# Patient Record
Sex: Male | Born: 2000 | Race: White | Hispanic: No | Marital: Single | Smoking: Never smoker
Health system: Southern US, Community
[De-identification: ages and names within clinical notes are randomized; demographics above are authoritative.]

## PROBLEM LIST (undated history)

## (undated) DIAGNOSIS — F988 Other specified behavioral and emotional disorders with onset usually occurring in childhood and adolescence: Secondary | ICD-10-CM

## (undated) HISTORY — DX: Other specified behavioral and emotional disorders with onset usually occurring in childhood and adolescence: F98.8

---
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
## 2020-03-14 IMAGING — MR MRI HEAD WITHOUT CONTRAST
10 series · 48 of 48 positions shown · non-contrast
Comparison: None.

CLINICAL DATA: 17-year-old male with chronic nausea and headaches.
History of head injury at age 6. Concentration deficit.

EXAM:
MRI HEAD WITHOUT CONTRAST
TECHNIQUE: Multiplanar, multiecho pulse sequences of the brain and surrounding
structures were obtained without intravenous contrast.

[Series 5: T1 · sagittal · 4.0mm · 0.75mm/px · 3 of 33 slices shown (1 of 2)]
[im 1/33]
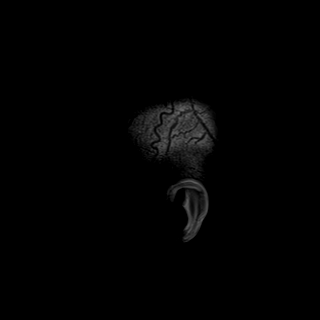
[im 17/33]
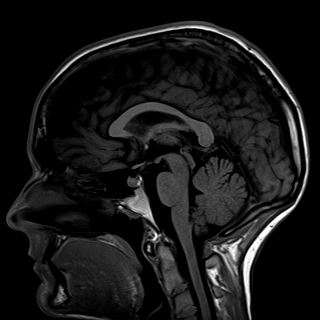
[im 33/33]
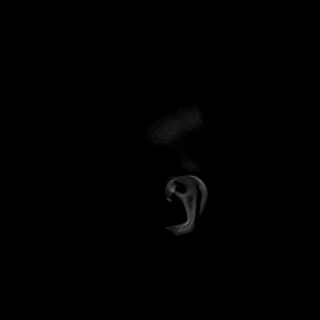

[Series 6: DWI · axial · 3.0mm · 1.50mm/px · z∈[-71,+93]mm · 7 of 102 slices shown (1 of 4)]
[im 1/102]
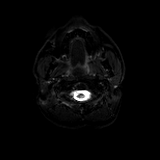
[im 17/102]
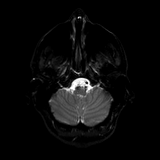
[im 34/102]
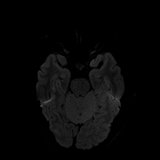
[im 51/102]
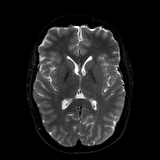
[im 68/102]
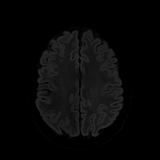
[im 85/102]
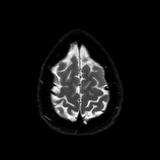
[im 102/102]
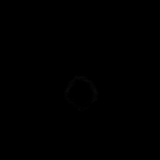

[Series 7: DWI · axial · 3.0mm · 1.50mm/px · z∈[-71,+93]mm · 4 of 50 slices shown (2 of 4)]
[im 1/50]
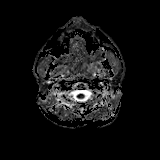
[im 17/50]
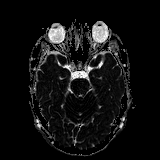
[im 33/50]
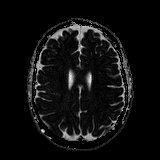
[im 50/50]
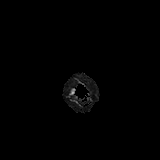

[Series 8: DWI · coronal · 5.0mm · 1.44mm/px · 5 of 73 slices shown (3 of 4)]
[im 1/73]
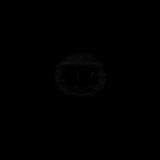
[im 19/73]
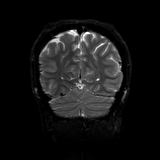
[im 37/73]
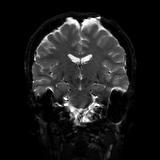
[im 55/73]
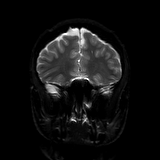
[im 73/73]
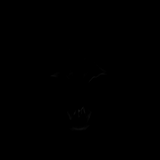

[Series 9: DWI · coronal · 5.0mm · 1.44mm/px · 3 of 37 slices shown (4 of 4)]
[im 1/37]
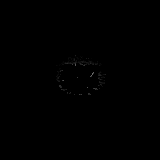
[im 19/37]
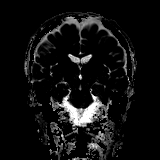
[im 37/37]
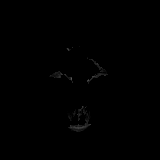

[Series 10: T2 · axial · 4.0mm · 0.38mm/px · z∈[-68,+97]mm · 2 of 33 slices shown (1 of 2)]
[im 1/33]
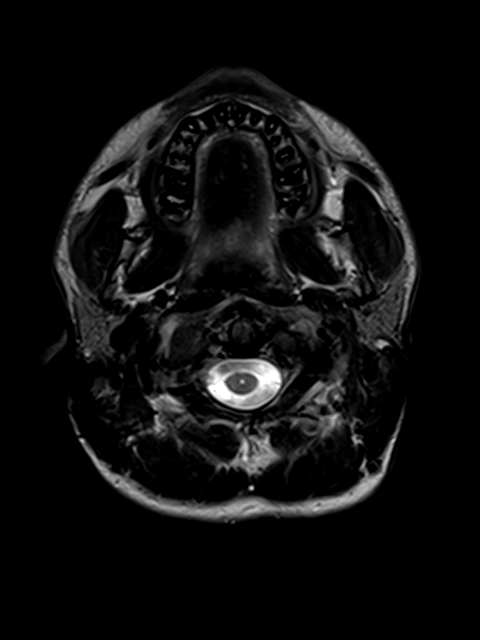
[im 33/33]
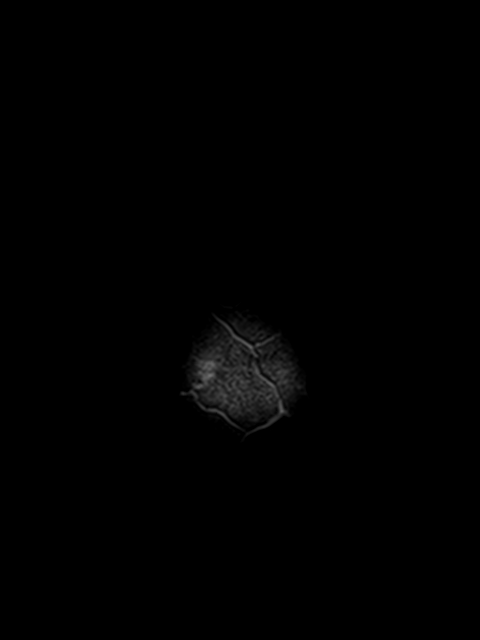

[Series 11: FLAIR · axial · 3.0mm · 0.75mm/px · z∈[-75,+97]mm · 2 of 30 slices shown]
[im 1/30]
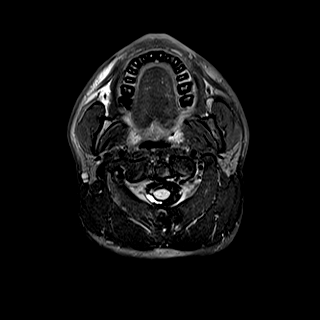
[im 30/30]
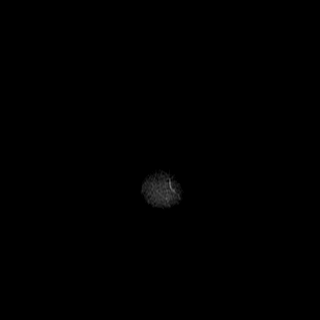

[Series 13: swi_images · axial · 1.5mm · 0.94mm/px · z∈[-68,+97]mm · 8 of 112 slices shown]
[im 1/112]
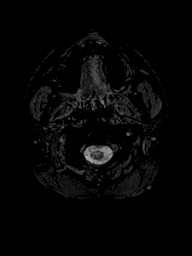
[im 16/112]
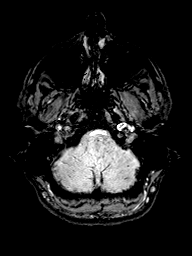
[im 32/112]
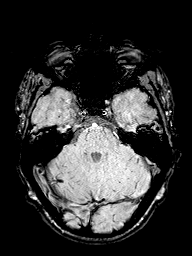
[im 48/112]
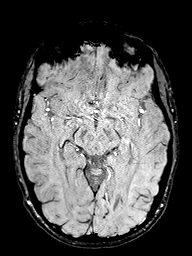
[im 64/112]
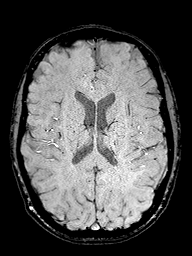
[im 80/112]
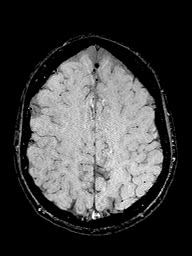
[im 96/112]
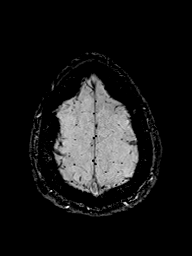
[im 112/112]
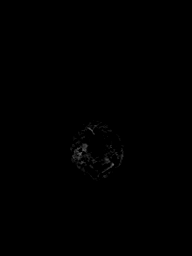

[Series 14: T1 · axial · 1.0mm · 0.94mm/px · z∈[-73,+101]mm · 12 of 176 slices shown (2 of 2)]
[im 1/176]
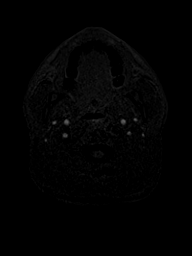
[im 16/176]
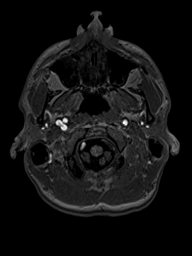
[im 32/176]
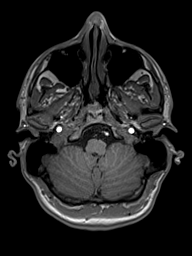
[im 48/176]
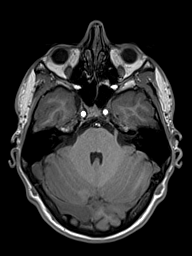
[im 64/176]
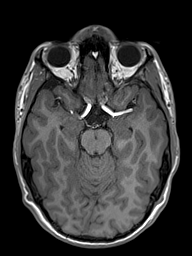
[im 80/176]
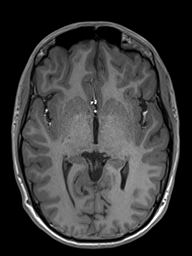
[im 96/176]
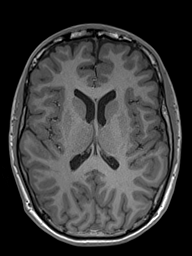
[im 112/176]
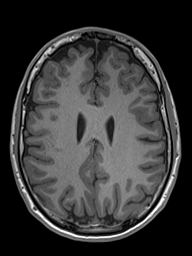
[im 128/176]
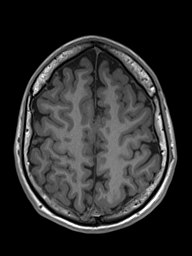
[im 144/176]
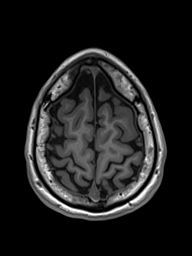
[im 160/176]
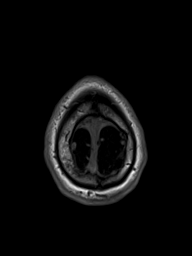
[im 176/176]
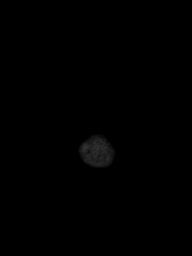

[Series 15: T2 · coronal · 4.5mm · 0.36mm/px · 2 of 35 slices shown (2 of 2)]
[im 1/35]
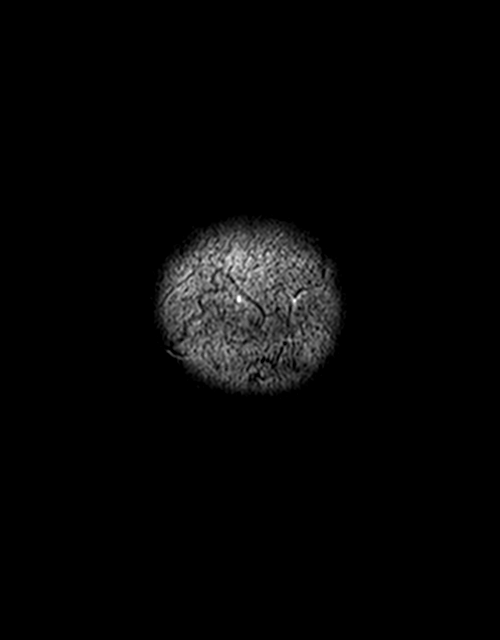
[im 35/35]
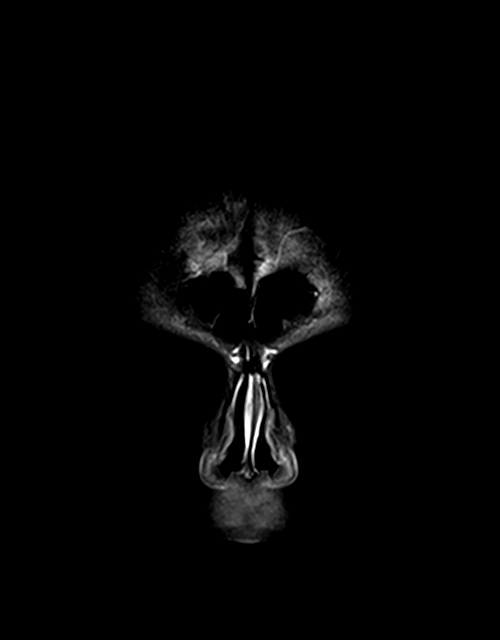

[48 of 48 positions shown; findings below may reference images not displayed]

FINDINGS: Brain: No restricted diffusion to suggest acute infarction. No
midline shift, mass effect, evidence of mass lesion,
ventriculomegaly, extra-axial collection or acute intracranial
hemorrhage. Cervicomedullary junction and pituitary are within
normal limits.

Gray and white matter signal is within normal limits throughout the
brain. No encephalomalacia or chronic cerebral blood products
identified.

Vascular: Major intracranial vascular flow voids are preserved.
Mildly tortuous distal vertebral arteries.

Skull and upper cervical spine: Negative visible cervical spine.
Visualized bone marrow signal is within normal limits.

Sinuses/Orbits: Negative orbits. Mild posterior ethmoid sinus
mucosal thickening. Small right maxillary sinus alveolar recess
mucous retention cyst (series 15, image 30).

Other: Mastoids are clear. Visible internal auditory structures
appear normal. Small nasopharyngeal midline retention cyst (series
10 image 6, normal variant). Scalp and face soft tissues appear
negative.
IMPRESSION: 1.  Normal MRI appearance of the brain.
2. Mild paranasal sinus mucosal disease, significance doubtful.

## 2021-05-21 ENCOUNTER — Encounter: Payer: Self-pay | Admitting: Family

## 2022-11-10 DIAGNOSIS — R000 Tachycardia, unspecified: Secondary | ICD-10-CM | POA: Diagnosis not present

## 2022-11-10 DIAGNOSIS — F481 Depersonalization-derealization syndrome: Secondary | ICD-10-CM | POA: Diagnosis not present

## 2022-11-10 DIAGNOSIS — F39 Unspecified mood [affective] disorder: Secondary | ICD-10-CM | POA: Diagnosis not present

## 2022-11-10 DIAGNOSIS — R440 Auditory hallucinations: Secondary | ICD-10-CM | POA: Diagnosis not present

## 2022-11-10 DIAGNOSIS — F449 Dissociative and conversion disorder, unspecified: Secondary | ICD-10-CM | POA: Diagnosis not present

## 2022-11-10 DIAGNOSIS — R45851 Suicidal ideations: Secondary | ICD-10-CM | POA: Diagnosis not present

## 2022-11-11 DIAGNOSIS — F481 Depersonalization-derealization syndrome: Secondary | ICD-10-CM | POA: Diagnosis not present

## 2022-11-11 DIAGNOSIS — R440 Auditory hallucinations: Secondary | ICD-10-CM | POA: Diagnosis not present

## 2022-11-11 DIAGNOSIS — F449 Dissociative and conversion disorder, unspecified: Secondary | ICD-10-CM | POA: Diagnosis not present

## 2022-11-11 DIAGNOSIS — R45851 Suicidal ideations: Secondary | ICD-10-CM | POA: Diagnosis not present

## 2022-11-12 DIAGNOSIS — F39 Unspecified mood [affective] disorder: Secondary | ICD-10-CM | POA: Diagnosis not present

## 2022-11-12 DIAGNOSIS — R440 Auditory hallucinations: Secondary | ICD-10-CM | POA: Diagnosis not present

## 2022-11-12 DIAGNOSIS — Z9152 Personal history of nonsuicidal self-harm: Secondary | ICD-10-CM | POA: Diagnosis not present

## 2022-11-12 DIAGNOSIS — R45851 Suicidal ideations: Secondary | ICD-10-CM | POA: Diagnosis not present

## 2022-11-12 DIAGNOSIS — F4310 Post-traumatic stress disorder, unspecified: Secondary | ICD-10-CM | POA: Diagnosis not present

## 2022-11-12 DIAGNOSIS — F449 Dissociative and conversion disorder, unspecified: Secondary | ICD-10-CM | POA: Diagnosis not present

## 2022-11-12 DIAGNOSIS — F411 Generalized anxiety disorder: Secondary | ICD-10-CM | POA: Diagnosis not present

## 2022-11-12 DIAGNOSIS — F23 Brief psychotic disorder: Secondary | ICD-10-CM | POA: Diagnosis not present

## 2022-11-12 DIAGNOSIS — Z62810 Personal history of physical and sexual abuse in childhood: Secondary | ICD-10-CM | POA: Diagnosis not present

## 2022-11-12 DIAGNOSIS — F251 Schizoaffective disorder, depressive type: Secondary | ICD-10-CM | POA: Diagnosis not present

## 2022-11-12 DIAGNOSIS — F1220 Cannabis dependence, uncomplicated: Secondary | ICD-10-CM | POA: Diagnosis not present

## 2022-11-12 DIAGNOSIS — Z743 Need for continuous supervision: Secondary | ICD-10-CM | POA: Diagnosis not present

## 2022-11-12 DIAGNOSIS — Z681 Body mass index (BMI) 19 or less, adult: Secondary | ICD-10-CM | POA: Diagnosis not present

## 2022-11-12 DIAGNOSIS — Z62811 Personal history of psychological abuse in childhood: Secondary | ICD-10-CM | POA: Diagnosis not present

## 2022-11-12 DIAGNOSIS — N390 Urinary tract infection, site not specified: Secondary | ICD-10-CM | POA: Diagnosis not present

## 2022-11-12 DIAGNOSIS — F481 Depersonalization-derealization syndrome: Secondary | ICD-10-CM | POA: Diagnosis not present

## 2022-11-12 DIAGNOSIS — R000 Tachycardia, unspecified: Secondary | ICD-10-CM | POA: Diagnosis not present

## 2022-11-16 DIAGNOSIS — Z681 Body mass index (BMI) 19 or less, adult: Secondary | ICD-10-CM | POA: Diagnosis not present

## 2022-11-16 DIAGNOSIS — F251 Schizoaffective disorder, depressive type: Secondary | ICD-10-CM | POA: Diagnosis not present

## 2022-11-16 DIAGNOSIS — R45851 Suicidal ideations: Secondary | ICD-10-CM | POA: Diagnosis not present

## 2022-11-16 DIAGNOSIS — N390 Urinary tract infection, site not specified: Secondary | ICD-10-CM | POA: Diagnosis not present

## 2023-01-14 DIAGNOSIS — R5382 Chronic fatigue, unspecified: Secondary | ICD-10-CM | POA: Diagnosis not present

## 2023-01-14 DIAGNOSIS — Z79899 Other long term (current) drug therapy: Secondary | ICD-10-CM | POA: Diagnosis not present

## 2023-01-29 DIAGNOSIS — Z79899 Other long term (current) drug therapy: Secondary | ICD-10-CM | POA: Diagnosis not present

## 2023-01-29 DIAGNOSIS — R5382 Chronic fatigue, unspecified: Secondary | ICD-10-CM | POA: Diagnosis not present

## 2023-04-01 ENCOUNTER — Other Ambulatory Visit (HOSPITAL_BASED_OUTPATIENT_CLINIC_OR_DEPARTMENT_OTHER): Payer: Self-pay

## 2023-04-01 ENCOUNTER — Other Ambulatory Visit (HOSPITAL_COMMUNITY): Payer: Self-pay

## 2023-04-01 DIAGNOSIS — F909 Attention-deficit hyperactivity disorder, unspecified type: Secondary | ICD-10-CM | POA: Diagnosis not present

## 2023-04-01 DIAGNOSIS — F1290 Cannabis use, unspecified, uncomplicated: Secondary | ICD-10-CM | POA: Diagnosis not present

## 2023-04-01 DIAGNOSIS — F3189 Other bipolar disorder: Secondary | ICD-10-CM | POA: Diagnosis not present

## 2023-04-01 MED ORDER — RISPERIDONE 2 MG PO TABS
2.0000 mg | ORAL_TABLET | Freq: Every day | ORAL | 0 refills | Status: DC
  Filled 2023-04-01: qty 30, 30d supply, fill #0

## 2023-04-01 MED ORDER — LITHIUM CARBONATE 600 MG PO CAPS
600.0000 mg | ORAL_CAPSULE | Freq: Every day | ORAL | 0 refills | Status: DC
  Filled 2023-04-01: qty 30, 30d supply, fill #0

## 2023-04-03 ENCOUNTER — Other Ambulatory Visit (HOSPITAL_COMMUNITY): Payer: Self-pay

## 2023-05-02 DIAGNOSIS — F909 Attention-deficit hyperactivity disorder, unspecified type: Secondary | ICD-10-CM | POA: Diagnosis not present

## 2023-05-02 DIAGNOSIS — F3189 Other bipolar disorder: Secondary | ICD-10-CM | POA: Diagnosis not present

## 2023-05-02 DIAGNOSIS — F1290 Cannabis use, unspecified, uncomplicated: Secondary | ICD-10-CM | POA: Diagnosis not present

## 2023-05-08 ENCOUNTER — Other Ambulatory Visit (HOSPITAL_COMMUNITY): Payer: Self-pay

## 2023-05-08 MED ORDER — RISPERIDONE 2 MG PO TABS
2.0000 mg | ORAL_TABLET | Freq: Every day | ORAL | 0 refills | Status: AC
  Filled 2023-07-28: qty 30, 30d supply, fill #0

## 2023-05-08 MED ORDER — LITHIUM CARBONATE 600 MG PO CAPS
600.0000 mg | ORAL_CAPSULE | Freq: Every day | ORAL | 1 refills | Status: AC
  Filled 2023-05-08: qty 30, 30d supply, fill #0
  Filled 2023-07-28: qty 30, 30d supply, fill #1

## 2023-05-08 MED ORDER — RISPERIDONE 2 MG PO TABS
2.0000 mg | ORAL_TABLET | Freq: Every day | ORAL | 0 refills | Status: AC
  Filled 2023-05-08: qty 30, 30d supply, fill #0

## 2023-06-30 ENCOUNTER — Other Ambulatory Visit (HOSPITAL_COMMUNITY): Payer: Self-pay

## 2023-06-30 DIAGNOSIS — F909 Attention-deficit hyperactivity disorder, unspecified type: Secondary | ICD-10-CM | POA: Diagnosis not present

## 2023-06-30 DIAGNOSIS — F3189 Other bipolar disorder: Secondary | ICD-10-CM | POA: Diagnosis not present

## 2023-06-30 DIAGNOSIS — F1290 Cannabis use, unspecified, uncomplicated: Secondary | ICD-10-CM | POA: Diagnosis not present

## 2023-06-30 MED ORDER — RISPERIDONE 2 MG PO TABS
ORAL_TABLET | ORAL | 1 refills | Status: DC
  Filled 2023-06-30: qty 30, 30d supply, fill #0

## 2023-06-30 MED ORDER — LITHIUM CARBONATE 600 MG PO CAPS
ORAL_CAPSULE | ORAL | 1 refills | Status: AC
  Filled 2023-06-30: qty 30, 30d supply, fill #0

## 2023-07-01 ENCOUNTER — Other Ambulatory Visit (HOSPITAL_COMMUNITY): Payer: Self-pay

## 2023-07-02 ENCOUNTER — Other Ambulatory Visit (HOSPITAL_COMMUNITY): Payer: Self-pay

## 2023-07-12 ENCOUNTER — Other Ambulatory Visit (HOSPITAL_COMMUNITY): Payer: Self-pay

## 2023-07-28 ENCOUNTER — Other Ambulatory Visit (HOSPITAL_COMMUNITY): Payer: Self-pay

## 2023-08-25 ENCOUNTER — Other Ambulatory Visit (HOSPITAL_BASED_OUTPATIENT_CLINIC_OR_DEPARTMENT_OTHER): Payer: Self-pay

## 2023-08-25 ENCOUNTER — Other Ambulatory Visit (HOSPITAL_COMMUNITY): Payer: Self-pay

## 2023-08-25 DIAGNOSIS — F1290 Cannabis use, unspecified, uncomplicated: Secondary | ICD-10-CM | POA: Diagnosis not present

## 2023-08-25 DIAGNOSIS — F3189 Other bipolar disorder: Secondary | ICD-10-CM | POA: Diagnosis not present

## 2023-08-25 DIAGNOSIS — F909 Attention-deficit hyperactivity disorder, unspecified type: Secondary | ICD-10-CM | POA: Diagnosis not present

## 2023-08-25 MED ORDER — LITHIUM CARBONATE 600 MG PO CAPS
600.0000 mg | ORAL_CAPSULE | Freq: Every day | ORAL | 1 refills | Status: AC
  Filled 2023-08-25: qty 30, 30d supply, fill #0

## 2023-08-28 ENCOUNTER — Other Ambulatory Visit (HOSPITAL_COMMUNITY): Payer: Self-pay

## 2023-08-30 ENCOUNTER — Other Ambulatory Visit (HOSPITAL_COMMUNITY): Payer: Self-pay

## 2023-09-18 ENCOUNTER — Other Ambulatory Visit (HOSPITAL_COMMUNITY): Payer: Self-pay

## 2023-10-20 ENCOUNTER — Other Ambulatory Visit (HOSPITAL_COMMUNITY): Payer: Self-pay

## 2023-10-20 DIAGNOSIS — F1290 Cannabis use, unspecified, uncomplicated: Secondary | ICD-10-CM | POA: Diagnosis not present

## 2023-10-20 DIAGNOSIS — F3189 Other bipolar disorder: Secondary | ICD-10-CM | POA: Diagnosis not present

## 2023-10-20 DIAGNOSIS — F909 Attention-deficit hyperactivity disorder, unspecified type: Secondary | ICD-10-CM | POA: Diagnosis not present

## 2023-10-20 MED ORDER — LITHIUM CARBONATE 600 MG PO CAPS
600.0000 mg | ORAL_CAPSULE | Freq: Every day | ORAL | 1 refills | Status: AC
  Filled 2023-10-20 – 2023-11-08 (×2): qty 30, 30d supply, fill #0

## 2023-10-30 ENCOUNTER — Other Ambulatory Visit (HOSPITAL_COMMUNITY): Payer: Self-pay

## 2023-11-06 ENCOUNTER — Other Ambulatory Visit (HOSPITAL_COMMUNITY): Payer: Self-pay

## 2023-11-07 ENCOUNTER — Other Ambulatory Visit (HOSPITAL_COMMUNITY): Payer: Self-pay

## 2023-11-08 ENCOUNTER — Other Ambulatory Visit (HOSPITAL_COMMUNITY): Payer: Self-pay

## 2023-12-12 ENCOUNTER — Other Ambulatory Visit (HOSPITAL_COMMUNITY): Payer: Self-pay

## 2023-12-12 DIAGNOSIS — F909 Attention-deficit hyperactivity disorder, unspecified type: Secondary | ICD-10-CM | POA: Diagnosis not present

## 2023-12-12 DIAGNOSIS — F3189 Other bipolar disorder: Secondary | ICD-10-CM | POA: Diagnosis not present

## 2023-12-12 DIAGNOSIS — F1290 Cannabis use, unspecified, uncomplicated: Secondary | ICD-10-CM | POA: Diagnosis not present

## 2023-12-12 MED ORDER — LITHIUM CARBONATE 600 MG PO CAPS
600.0000 mg | ORAL_CAPSULE | Freq: Every day | ORAL | 1 refills | Status: AC
  Filled 2023-12-12: qty 30, 30d supply, fill #0

## 2023-12-12 MED ORDER — RISPERIDONE 2 MG PO TABS
2.0000 mg | ORAL_TABLET | Freq: Every day | ORAL | 0 refills | Status: AC
  Filled 2023-12-12: qty 30, 30d supply, fill #0

## 2023-12-15 ENCOUNTER — Other Ambulatory Visit (HOSPITAL_COMMUNITY): Payer: Self-pay

## 2023-12-19 DIAGNOSIS — Z113 Encounter for screening for infections with a predominantly sexual mode of transmission: Secondary | ICD-10-CM | POA: Diagnosis not present

## 2023-12-19 DIAGNOSIS — Z23 Encounter for immunization: Secondary | ICD-10-CM | POA: Diagnosis not present

## 2023-12-19 DIAGNOSIS — N3000 Acute cystitis without hematuria: Secondary | ICD-10-CM | POA: Diagnosis not present

## 2023-12-22 ENCOUNTER — Other Ambulatory Visit (HOSPITAL_COMMUNITY): Payer: Self-pay

## 2023-12-25 ENCOUNTER — Other Ambulatory Visit (HOSPITAL_COMMUNITY): Payer: Self-pay

## 2024-02-06 ENCOUNTER — Other Ambulatory Visit (HOSPITAL_COMMUNITY): Payer: Self-pay

## 2024-02-06 DIAGNOSIS — F3189 Other bipolar disorder: Secondary | ICD-10-CM | POA: Diagnosis not present

## 2024-02-06 DIAGNOSIS — F1290 Cannabis use, unspecified, uncomplicated: Secondary | ICD-10-CM | POA: Diagnosis not present

## 2024-02-06 MED ORDER — LITHIUM CARBONATE 600 MG PO CAPS
600.0000 mg | ORAL_CAPSULE | Freq: Every day | ORAL | 1 refills | Status: AC
  Filled 2024-02-06: qty 30, 30d supply, fill #0

## 2024-02-07 ENCOUNTER — Other Ambulatory Visit (HOSPITAL_COMMUNITY): Payer: Self-pay

## 2024-02-16 DIAGNOSIS — R310 Gross hematuria: Secondary | ICD-10-CM | POA: Diagnosis not present

## 2024-02-16 DIAGNOSIS — R3915 Urgency of urination: Secondary | ICD-10-CM | POA: Diagnosis not present

## 2024-02-24 ENCOUNTER — Other Ambulatory Visit (HOSPITAL_COMMUNITY): Payer: Self-pay | Admitting: Urology

## 2024-02-24 DIAGNOSIS — R310 Gross hematuria: Secondary | ICD-10-CM

## 2024-02-25 ENCOUNTER — Inpatient Hospital Stay: Admission: RE | Admit: 2024-02-25 | Payer: Self-pay | Source: Ambulatory Visit

## 2024-02-26 ENCOUNTER — Inpatient Hospital Stay
Admission: RE | Admit: 2024-02-26 | Discharge: 2024-02-26 | Disposition: A | Payer: Self-pay | Source: Ambulatory Visit | Attending: Urology

## 2024-02-26 DIAGNOSIS — R310 Gross hematuria: Secondary | ICD-10-CM

## 2024-02-26 MED ORDER — IOPAMIDOL (ISOVUE-300) INJECTION 61%
125.0000 mL | Freq: Once | INTRAVENOUS | Status: AC | PRN
  Administered 2024-02-26: 125 mL via INTRAVENOUS

## 2024-03-15 DIAGNOSIS — F909 Attention-deficit hyperactivity disorder, unspecified type: Secondary | ICD-10-CM | POA: Diagnosis not present

## 2024-03-15 DIAGNOSIS — F1290 Cannabis use, unspecified, uncomplicated: Secondary | ICD-10-CM | POA: Diagnosis not present

## 2024-03-15 DIAGNOSIS — F3189 Other bipolar disorder: Secondary | ICD-10-CM | POA: Diagnosis not present

## 2024-04-02 ENCOUNTER — Other Ambulatory Visit (HOSPITAL_COMMUNITY): Payer: Self-pay

## 2024-04-02 MED ORDER — LITHIUM CARBONATE 600 MG PO CAPS
600.0000 mg | ORAL_CAPSULE | Freq: Every day | ORAL | 1 refills | Status: AC
  Filled 2024-04-02: qty 30, 30d supply, fill #0

## 2024-04-02 MED ORDER — RISPERIDONE 2 MG PO TABS
2.0000 mg | ORAL_TABLET | Freq: Every day | ORAL | 0 refills | Status: AC
  Filled 2024-04-02: qty 30, 30d supply, fill #0

## 2024-04-09 ENCOUNTER — Other Ambulatory Visit (HOSPITAL_COMMUNITY): Payer: Self-pay
